# Patient Record
Sex: Female | Born: 1995 | Race: White | Hispanic: No | Marital: Single | Smoking: Never smoker
Health system: Southern US, Community
[De-identification: ages and names within clinical notes are randomized; demographics above are authoritative.]

## PROBLEM LIST (undated history)

## (undated) DIAGNOSIS — F329 Major depressive disorder, single episode, unspecified: Secondary | ICD-10-CM

## (undated) DIAGNOSIS — F32A Depression, unspecified: Secondary | ICD-10-CM

## (undated) HISTORY — PX: ANKLE SURGERY: SHX546

## (undated) HISTORY — PX: WRIST SURGERY: SHX841

---
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
## 2015-01-08 IMAGING — CR DG CHEST 2V
2 series · 2 of 2 positions shown · non-contrast
Comparison: None.

CLINICAL DATA: Right-sided chest pain.

EXAM:
CHEST  2 VIEW

[w chest pa]
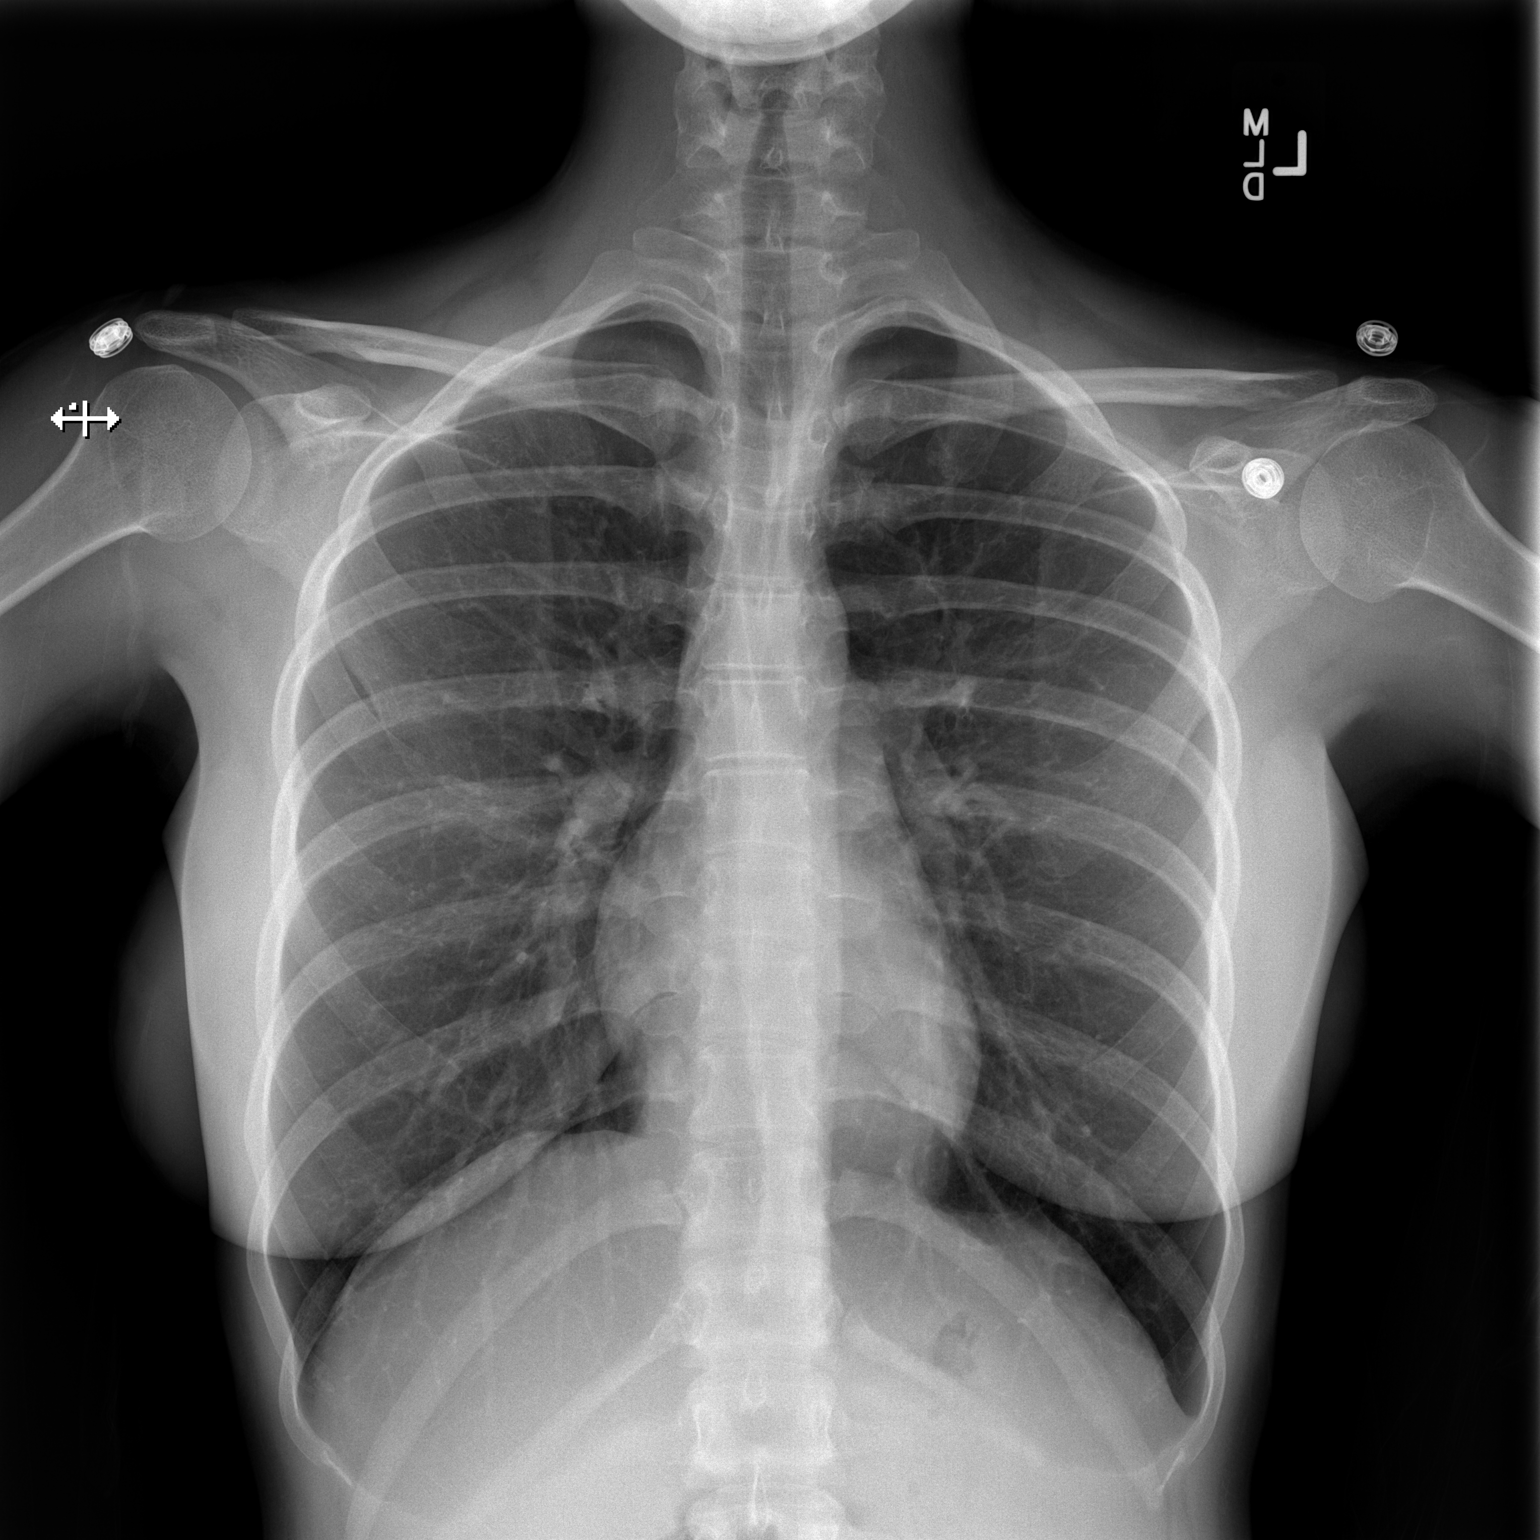

[w chest lat]
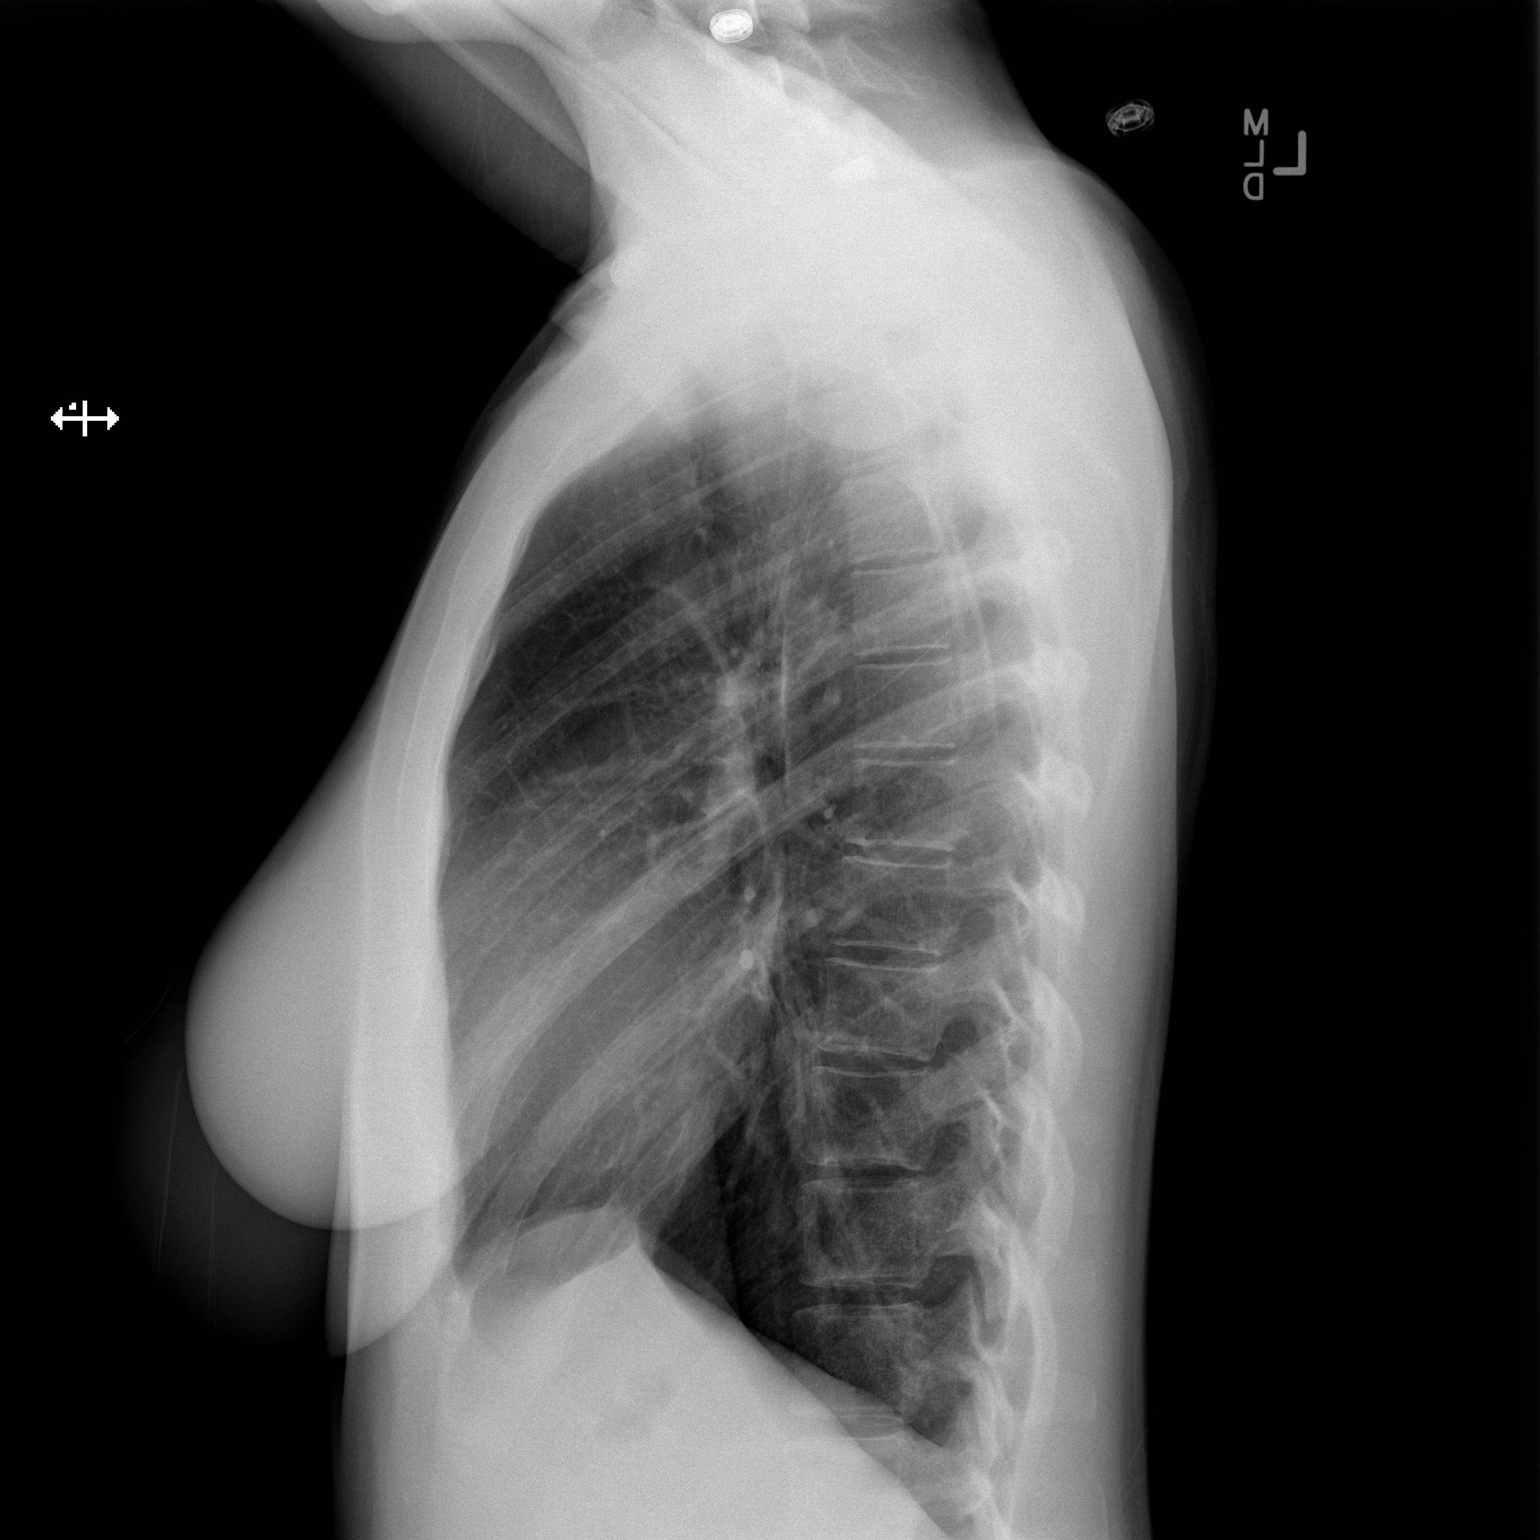

[2 of 2 positions shown; findings below may reference images not displayed]

FINDINGS: The cardiac silhouette, mediastinal and hilar contours are within
normal limits. The lungs are clear. No pleural effusion or
pneumothorax. The bony thorax is intact.
IMPRESSION: Normal chest x-ray.

## 2017-04-02 DIAGNOSIS — L700 Acne vulgaris: Secondary | ICD-10-CM | POA: Diagnosis not present

## 2017-04-28 DIAGNOSIS — S86012A Strain of left Achilles tendon, initial encounter: Secondary | ICD-10-CM | POA: Diagnosis not present

## 2017-04-28 DIAGNOSIS — M79672 Pain in left foot: Secondary | ICD-10-CM | POA: Diagnosis not present

## 2017-08-11 ENCOUNTER — Other Ambulatory Visit: Payer: Self-pay | Admitting: Family Medicine

## 2017-08-11 ENCOUNTER — Other Ambulatory Visit (HOSPITAL_COMMUNITY)
Admission: RE | Admit: 2017-08-11 | Discharge: 2017-08-11 | Disposition: A | Discharge status: Home / Self Care | Payer: 59 | Source: Ambulatory Visit | Attending: Family Medicine | Admitting: Family Medicine

## 2017-08-11 DIAGNOSIS — Z0000 Encounter for general adult medical examination without abnormal findings: Secondary | ICD-10-CM | POA: Diagnosis not present

## 2017-08-11 DIAGNOSIS — Z124 Encounter for screening for malignant neoplasm of cervix: Secondary | ICD-10-CM | POA: Diagnosis not present

## 2017-08-13 LAB — CYTOLOGY - PAP: Diagnosis: NEGATIVE

## 2017-10-26 DIAGNOSIS — Z23 Encounter for immunization: Secondary | ICD-10-CM | POA: Diagnosis not present

## 2018-01-07 DIAGNOSIS — B373 Candidiasis of vulva and vagina: Secondary | ICD-10-CM | POA: Diagnosis not present

## 2018-01-07 DIAGNOSIS — N898 Other specified noninflammatory disorders of vagina: Secondary | ICD-10-CM | POA: Diagnosis not present

## 2018-01-20 DIAGNOSIS — N898 Other specified noninflammatory disorders of vagina: Secondary | ICD-10-CM | POA: Diagnosis not present

## 2018-03-02 DIAGNOSIS — H1045 Other chronic allergic conjunctivitis: Secondary | ICD-10-CM | POA: Diagnosis not present

## 2018-03-02 DIAGNOSIS — H16101 Unspecified superficial keratitis, right eye: Secondary | ICD-10-CM | POA: Diagnosis not present

## 2020-01-17 ENCOUNTER — Other Ambulatory Visit: Payer: Self-pay

## 2020-01-17 DIAGNOSIS — Z20822 Contact with and (suspected) exposure to covid-19: Secondary | ICD-10-CM

## 2020-01-20 LAB — NOVEL CORONAVIRUS, NAA: SARS-CoV-2, NAA: NOT DETECTED

## 2020-03-12 DIAGNOSIS — Z30432 Encounter for removal of intrauterine contraceptive device: Secondary | ICD-10-CM | POA: Diagnosis not present

## 2020-03-12 DIAGNOSIS — L709 Acne, unspecified: Secondary | ICD-10-CM | POA: Diagnosis not present

## 2020-03-12 DIAGNOSIS — F3281 Premenstrual dysphoric disorder: Secondary | ICD-10-CM | POA: Diagnosis not present

## 2020-05-06 DIAGNOSIS — B001 Herpesviral vesicular dermatitis: Secondary | ICD-10-CM | POA: Diagnosis not present

## 2020-05-06 DIAGNOSIS — L2489 Irritant contact dermatitis due to other agents: Secondary | ICD-10-CM | POA: Diagnosis not present

## 2020-09-20 DIAGNOSIS — R635 Abnormal weight gain: Secondary | ICD-10-CM | POA: Diagnosis not present

## 2020-09-20 DIAGNOSIS — Z13220 Encounter for screening for lipoid disorders: Secondary | ICD-10-CM | POA: Diagnosis not present

## 2020-09-20 DIAGNOSIS — Z79899 Other long term (current) drug therapy: Secondary | ICD-10-CM | POA: Diagnosis not present

## 2020-09-20 DIAGNOSIS — F411 Generalized anxiety disorder: Secondary | ICD-10-CM | POA: Diagnosis not present

## 2020-09-20 DIAGNOSIS — E669 Obesity, unspecified: Secondary | ICD-10-CM | POA: Diagnosis not present

## 2020-09-20 DIAGNOSIS — Z23 Encounter for immunization: Secondary | ICD-10-CM | POA: Diagnosis not present

## 2021-02-03 DIAGNOSIS — M542 Cervicalgia: Secondary | ICD-10-CM | POA: Diagnosis not present

## 2021-02-03 DIAGNOSIS — M25511 Pain in right shoulder: Secondary | ICD-10-CM | POA: Diagnosis not present

## 2021-02-03 DIAGNOSIS — M546 Pain in thoracic spine: Secondary | ICD-10-CM | POA: Diagnosis not present

## 2021-02-03 DIAGNOSIS — M25512 Pain in left shoulder: Secondary | ICD-10-CM | POA: Diagnosis not present

## 2021-02-10 DIAGNOSIS — M25512 Pain in left shoulder: Secondary | ICD-10-CM | POA: Diagnosis not present

## 2021-02-10 DIAGNOSIS — M542 Cervicalgia: Secondary | ICD-10-CM | POA: Diagnosis not present

## 2021-02-10 DIAGNOSIS — M25511 Pain in right shoulder: Secondary | ICD-10-CM | POA: Diagnosis not present

## 2021-02-10 DIAGNOSIS — M546 Pain in thoracic spine: Secondary | ICD-10-CM | POA: Diagnosis not present

## 2021-02-17 DIAGNOSIS — M542 Cervicalgia: Secondary | ICD-10-CM | POA: Diagnosis not present

## 2021-02-17 DIAGNOSIS — M25511 Pain in right shoulder: Secondary | ICD-10-CM | POA: Diagnosis not present

## 2021-02-17 DIAGNOSIS — M546 Pain in thoracic spine: Secondary | ICD-10-CM | POA: Diagnosis not present

## 2021-02-17 DIAGNOSIS — M25512 Pain in left shoulder: Secondary | ICD-10-CM | POA: Diagnosis not present

## 2021-02-24 DIAGNOSIS — M25512 Pain in left shoulder: Secondary | ICD-10-CM | POA: Diagnosis not present

## 2021-02-24 DIAGNOSIS — M546 Pain in thoracic spine: Secondary | ICD-10-CM | POA: Diagnosis not present

## 2021-02-24 DIAGNOSIS — M25511 Pain in right shoulder: Secondary | ICD-10-CM | POA: Diagnosis not present

## 2021-02-24 DIAGNOSIS — M542 Cervicalgia: Secondary | ICD-10-CM | POA: Diagnosis not present

## 2021-04-08 DIAGNOSIS — L820 Inflamed seborrheic keratosis: Secondary | ICD-10-CM | POA: Diagnosis not present

## 2021-04-08 DIAGNOSIS — D225 Melanocytic nevi of trunk: Secondary | ICD-10-CM | POA: Diagnosis not present

## 2021-04-08 DIAGNOSIS — L814 Other melanin hyperpigmentation: Secondary | ICD-10-CM | POA: Diagnosis not present

## 2021-04-08 DIAGNOSIS — B079 Viral wart, unspecified: Secondary | ICD-10-CM | POA: Diagnosis not present

## 2021-04-09 DIAGNOSIS — M25512 Pain in left shoulder: Secondary | ICD-10-CM | POA: Diagnosis not present

## 2021-04-09 DIAGNOSIS — M546 Pain in thoracic spine: Secondary | ICD-10-CM | POA: Diagnosis not present

## 2021-04-09 DIAGNOSIS — M542 Cervicalgia: Secondary | ICD-10-CM | POA: Diagnosis not present

## 2021-04-09 DIAGNOSIS — M25511 Pain in right shoulder: Secondary | ICD-10-CM | POA: Diagnosis not present

## 2021-04-21 DIAGNOSIS — A499 Bacterial infection, unspecified: Secondary | ICD-10-CM | POA: Diagnosis not present

## 2021-05-07 DIAGNOSIS — Z124 Encounter for screening for malignant neoplasm of cervix: Secondary | ICD-10-CM | POA: Diagnosis not present

## 2021-05-07 DIAGNOSIS — Z3041 Encounter for surveillance of contraceptive pills: Secondary | ICD-10-CM | POA: Diagnosis not present

## 2021-05-07 DIAGNOSIS — Z113 Encounter for screening for infections with a predominantly sexual mode of transmission: Secondary | ICD-10-CM | POA: Diagnosis not present

## 2021-05-07 DIAGNOSIS — Z01419 Encounter for gynecological examination (general) (routine) without abnormal findings: Secondary | ICD-10-CM | POA: Diagnosis not present

## 2021-09-14 DIAGNOSIS — S32039A Unspecified fracture of third lumbar vertebra, initial encounter for closed fracture: Secondary | ICD-10-CM | POA: Diagnosis not present

## 2021-09-14 DIAGNOSIS — R079 Chest pain, unspecified: Secondary | ICD-10-CM | POA: Diagnosis not present

## 2021-09-14 DIAGNOSIS — S0990XA Unspecified injury of head, initial encounter: Secondary | ICD-10-CM | POA: Diagnosis not present

## 2021-09-14 DIAGNOSIS — S32038A Other fracture of third lumbar vertebra, initial encounter for closed fracture: Secondary | ICD-10-CM | POA: Diagnosis not present

## 2021-09-14 DIAGNOSIS — Y999 Unspecified external cause status: Secondary | ICD-10-CM | POA: Diagnosis not present

## 2021-09-14 DIAGNOSIS — R0789 Other chest pain: Secondary | ICD-10-CM | POA: Diagnosis not present

## 2021-09-14 DIAGNOSIS — S2002XA Contusion of left breast, initial encounter: Secondary | ICD-10-CM | POA: Diagnosis not present

## 2021-09-14 DIAGNOSIS — M542 Cervicalgia: Secondary | ICD-10-CM | POA: Diagnosis not present

## 2021-09-14 DIAGNOSIS — Y92410 Unspecified street and highway as the place of occurrence of the external cause: Secondary | ICD-10-CM | POA: Diagnosis not present

## 2021-09-14 DIAGNOSIS — S301XXA Contusion of abdominal wall, initial encounter: Secondary | ICD-10-CM | POA: Diagnosis not present

## 2021-09-23 DIAGNOSIS — S060XAA Concussion with loss of consciousness status unknown, initial encounter: Secondary | ICD-10-CM | POA: Diagnosis not present

## 2021-09-23 DIAGNOSIS — S4992XD Unspecified injury of left shoulder and upper arm, subsequent encounter: Secondary | ICD-10-CM | POA: Diagnosis not present

## 2021-09-23 DIAGNOSIS — M546 Pain in thoracic spine: Secondary | ICD-10-CM | POA: Diagnosis not present

## 2021-09-23 DIAGNOSIS — S32038A Other fracture of third lumbar vertebra, initial encounter for closed fracture: Secondary | ICD-10-CM | POA: Diagnosis not present

## 2021-09-23 DIAGNOSIS — M898X1 Other specified disorders of bone, shoulder: Secondary | ICD-10-CM | POA: Diagnosis not present

## 2021-10-01 DIAGNOSIS — S060X0A Concussion without loss of consciousness, initial encounter: Secondary | ICD-10-CM | POA: Diagnosis not present

## 2021-10-16 DIAGNOSIS — M25511 Pain in right shoulder: Secondary | ICD-10-CM | POA: Diagnosis not present

## 2021-10-16 DIAGNOSIS — M546 Pain in thoracic spine: Secondary | ICD-10-CM | POA: Diagnosis not present

## 2021-10-16 DIAGNOSIS — M542 Cervicalgia: Secondary | ICD-10-CM | POA: Diagnosis not present

## 2021-10-16 DIAGNOSIS — M25512 Pain in left shoulder: Secondary | ICD-10-CM | POA: Diagnosis not present

## 2021-10-23 DIAGNOSIS — M542 Cervicalgia: Secondary | ICD-10-CM | POA: Diagnosis not present

## 2021-10-23 DIAGNOSIS — M546 Pain in thoracic spine: Secondary | ICD-10-CM | POA: Diagnosis not present

## 2021-10-23 DIAGNOSIS — M25512 Pain in left shoulder: Secondary | ICD-10-CM | POA: Diagnosis not present

## 2021-10-23 DIAGNOSIS — M25511 Pain in right shoulder: Secondary | ICD-10-CM | POA: Diagnosis not present

## 2021-10-29 DIAGNOSIS — M25511 Pain in right shoulder: Secondary | ICD-10-CM | POA: Diagnosis not present

## 2021-10-29 DIAGNOSIS — M546 Pain in thoracic spine: Secondary | ICD-10-CM | POA: Diagnosis not present

## 2021-10-29 DIAGNOSIS — M25512 Pain in left shoulder: Secondary | ICD-10-CM | POA: Diagnosis not present

## 2021-10-29 DIAGNOSIS — M542 Cervicalgia: Secondary | ICD-10-CM | POA: Diagnosis not present

## 2021-11-04 DIAGNOSIS — F0781 Postconcussional syndrome: Secondary | ICD-10-CM | POA: Diagnosis not present

## 2021-11-04 DIAGNOSIS — G4730 Sleep apnea, unspecified: Secondary | ICD-10-CM | POA: Diagnosis not present

## 2021-11-04 DIAGNOSIS — R400 Somnolence: Secondary | ICD-10-CM | POA: Diagnosis not present

## 2021-11-04 DIAGNOSIS — R0683 Snoring: Secondary | ICD-10-CM | POA: Diagnosis not present

## 2021-11-05 DIAGNOSIS — M542 Cervicalgia: Secondary | ICD-10-CM | POA: Diagnosis not present

## 2021-11-05 DIAGNOSIS — M25512 Pain in left shoulder: Secondary | ICD-10-CM | POA: Diagnosis not present

## 2021-11-05 DIAGNOSIS — M546 Pain in thoracic spine: Secondary | ICD-10-CM | POA: Diagnosis not present

## 2021-11-05 DIAGNOSIS — M25511 Pain in right shoulder: Secondary | ICD-10-CM | POA: Diagnosis not present
